# Patient Record
Sex: Female | Born: 1960 | Race: White | Hispanic: No | Marital: Married | Smoking: Never smoker
Health system: Southern US, Community
[De-identification: ages and names within clinical notes are randomized; demographics above are authoritative.]

## PROBLEM LIST (undated history)

## (undated) DIAGNOSIS — E785 Hyperlipidemia, unspecified: Secondary | ICD-10-CM

## (undated) HISTORY — PX: COSMETIC SURGERY: SHX468

## (undated) HISTORY — DX: Hyperlipidemia, unspecified: E78.5

---

## 2002-03-05 ENCOUNTER — Other Ambulatory Visit: Admission: RE | Admit: 2002-03-05 | Discharge: 2002-03-05 | Payer: Self-pay | Admitting: Obstetrics & Gynecology

## 2002-05-16 ENCOUNTER — Encounter: Payer: Self-pay | Admitting: Obstetrics & Gynecology

## 2002-05-16 ENCOUNTER — Encounter: Admission: RE | Admit: 2002-05-16 | Discharge: 2002-05-16 | Payer: Self-pay | Admitting: Obstetrics & Gynecology

## 2003-05-18 ENCOUNTER — Other Ambulatory Visit: Admission: RE | Admit: 2003-05-18 | Discharge: 2003-05-18 | Payer: Self-pay | Admitting: Obstetrics & Gynecology

## 2004-09-25 HISTORY — PX: ENDOMETRIAL ABLATION: SHX621

## 2004-11-01 ENCOUNTER — Other Ambulatory Visit: Admission: RE | Admit: 2004-11-01 | Discharge: 2004-11-01 | Payer: Self-pay | Admitting: Obstetrics & Gynecology

## 2006-12-25 ENCOUNTER — Emergency Department (HOSPITAL_COMMUNITY): Admission: EM | Admit: 2006-12-25 | Discharge: 2006-12-25 | Payer: Self-pay | Admitting: Family Medicine

## 2007-04-12 ENCOUNTER — Ambulatory Visit: Payer: Self-pay | Admitting: Internal Medicine

## 2007-04-12 LAB — CONVERTED CEMR LAB
ALT: 28 units/L (ref 0–35)
AST: 26 units/L (ref 0–37)
Albumin: 4.1 g/dL (ref 3.5–5.2)
Alkaline Phosphatase: 48 units/L (ref 39–117)
BUN: 7 mg/dL (ref 6–23)
Basophils Absolute: 0.1 10*3/uL (ref 0.0–0.1)
Basophils Relative: 1.5 % — ABNORMAL HIGH (ref 0.0–1.0)
Bilirubin, Direct: 0.1 mg/dL (ref 0.0–0.3)
CO2: 28 meq/L (ref 19–32)
Calcium: 9.2 mg/dL (ref 8.4–10.5)
Chloride: 107 meq/L (ref 96–112)
Cholesterol: 251 mg/dL (ref 0–200)
Creatinine, Ser: 0.8 mg/dL (ref 0.4–1.2)
Direct LDL: 142.8 mg/dL
Eosinophils Absolute: 0.1 10*3/uL (ref 0.0–0.6)
Eosinophils Relative: 3 % (ref 0.0–5.0)
GFR calc Af Amer: 99 mL/min
GFR calc non Af Amer: 82 mL/min
Glucose, Bld: 93 mg/dL (ref 70–99)
HCT: 39 % (ref 36.0–46.0)
HDL: 41.6 mg/dL (ref >39.0)
Hemoglobin: 13.8 g/dL (ref 12.0–15.0)
Lymphocytes Relative: 41.2 % (ref 12.0–46.0)
MCHC: 35.5 g/dL (ref 30.0–36.0)
MCV: 93.6 fL (ref 78.0–100.0)
Monocytes Absolute: 0.4 10*3/uL (ref 0.2–0.7)
Monocytes Relative: 10.5 % (ref 3.0–11.0)
Neutro Abs: 1.7 10*3/uL (ref 1.4–7.7)
Neutrophils Relative %: 43.8 % (ref 43.0–77.0)
Platelets: 221 10*3/uL (ref 150–400)
Potassium: 4.5 meq/L (ref 3.5–5.1)
RBC: 4.17 M/uL (ref 3.87–5.11)
RDW: 11.6 % (ref 11.5–14.6)
Sodium: 142 meq/L (ref 135–145)
TSH: 2.19 microintl units/mL (ref 0.35–5.50)
Total Bilirubin: 0.8 mg/dL (ref 0.3–1.2)
Total CHOL/HDL Ratio: 6
Total Protein: 6.9 g/dL (ref 6.0–8.3)
Triglycerides: 537 mg/dL (ref 0–149)
VLDL: 107 mg/dL — ABNORMAL HIGH (ref 0–40)
WBC: 3.9 10*3/uL — ABNORMAL LOW (ref 4.5–10.5)

## 2007-04-30 ENCOUNTER — Encounter: Admission: RE | Admit: 2007-04-30 | Discharge: 2007-04-30 | Payer: Self-pay | Admitting: Obstetrics & Gynecology

## 2007-05-14 ENCOUNTER — Ambulatory Visit: Payer: Self-pay | Admitting: Internal Medicine

## 2008-07-04 IMAGING — CR DG CERVICAL SPINE COMPLETE 4+V
5 series · 5 of 5 positions shown · non-contrast
Comparison: none

CLINICAL DATA: Neck pain and stiffness. No known injury.
 CERVICAL SPINE ? 5 VIEW:

[view not recorded (1 of 5)]
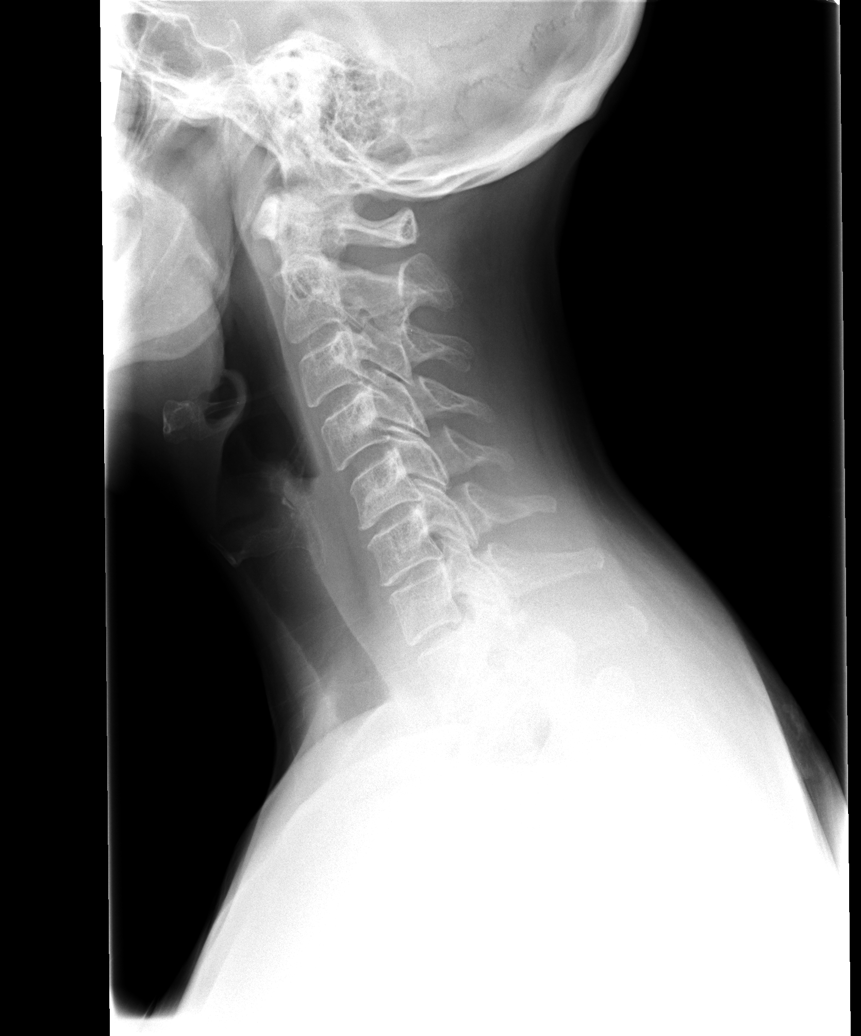

[view not recorded (2 of 5)]
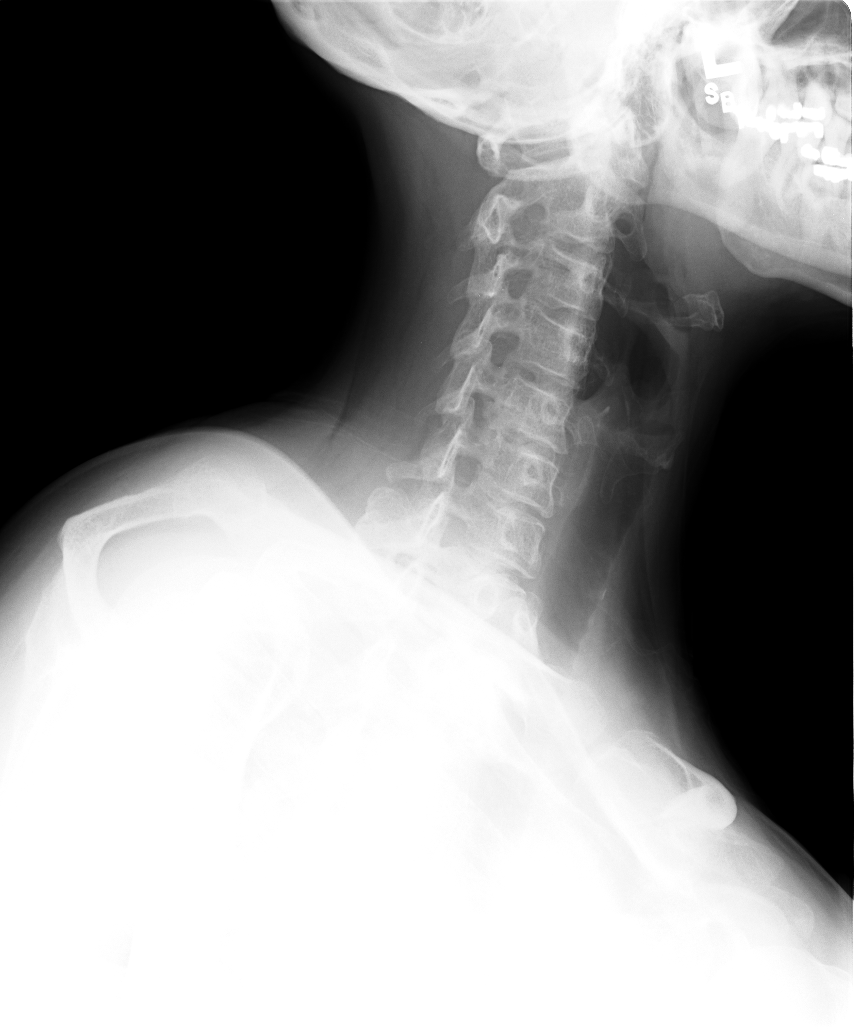

[view not recorded (3 of 5)]
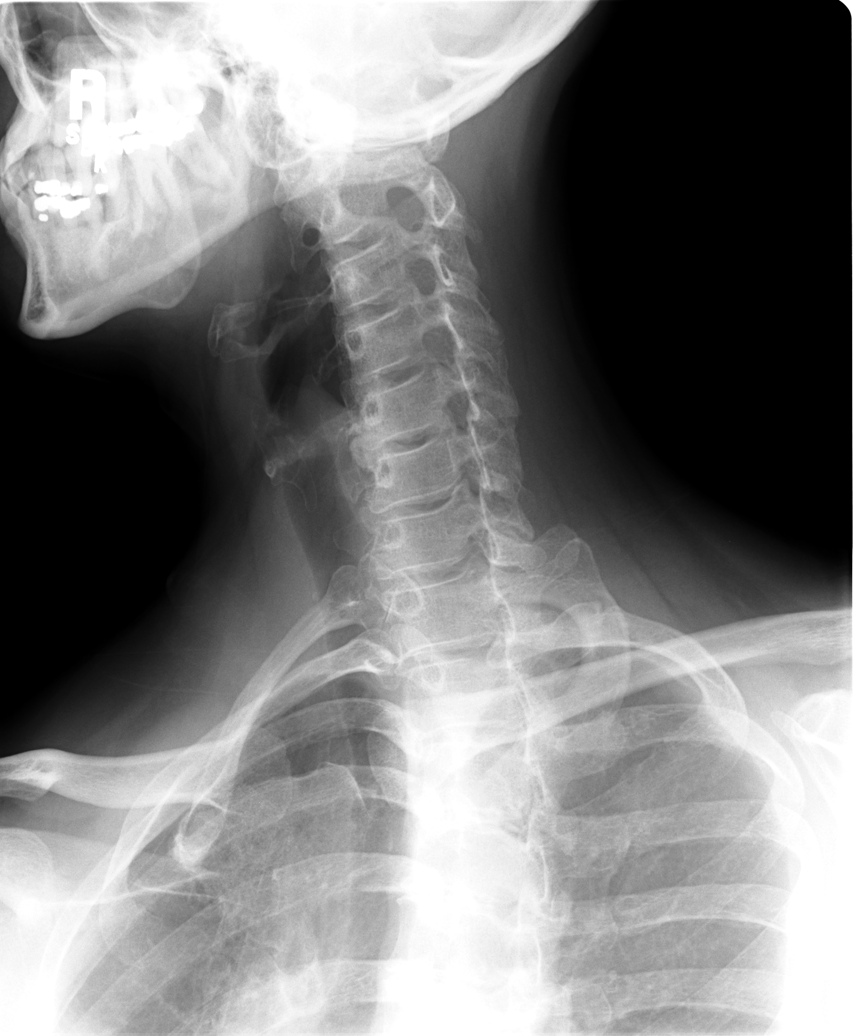

[view not recorded (4 of 5)]
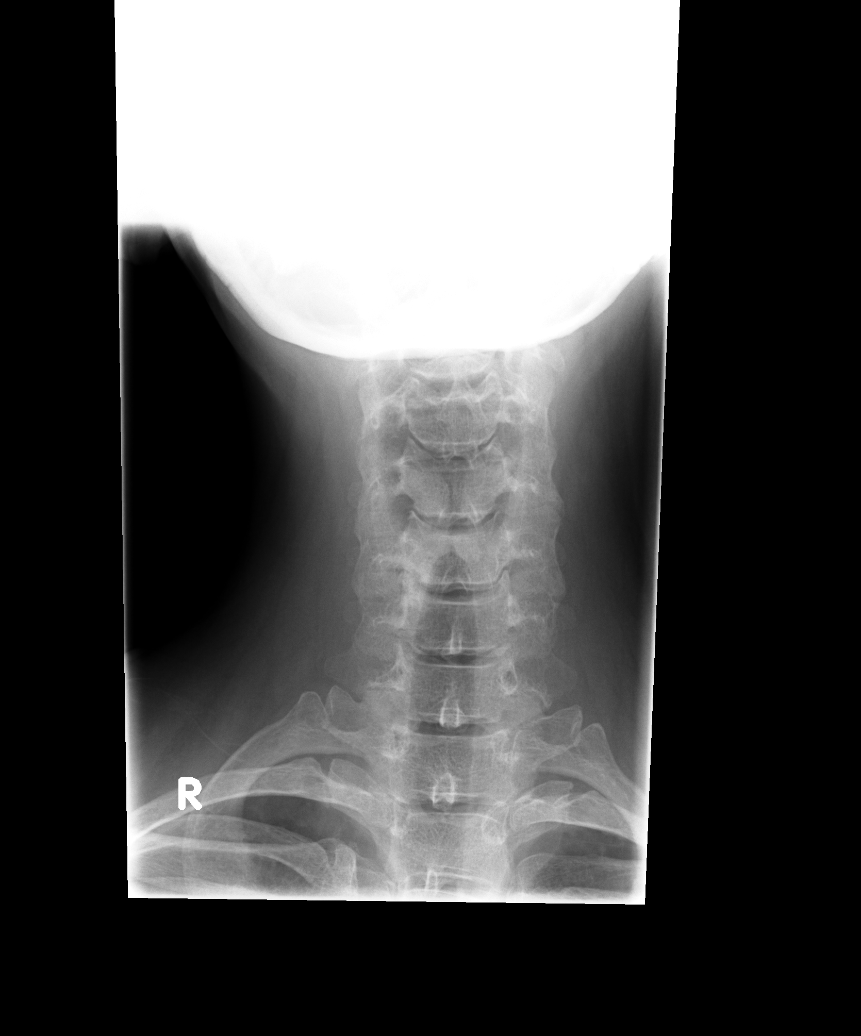

[view not recorded (5 of 5)]
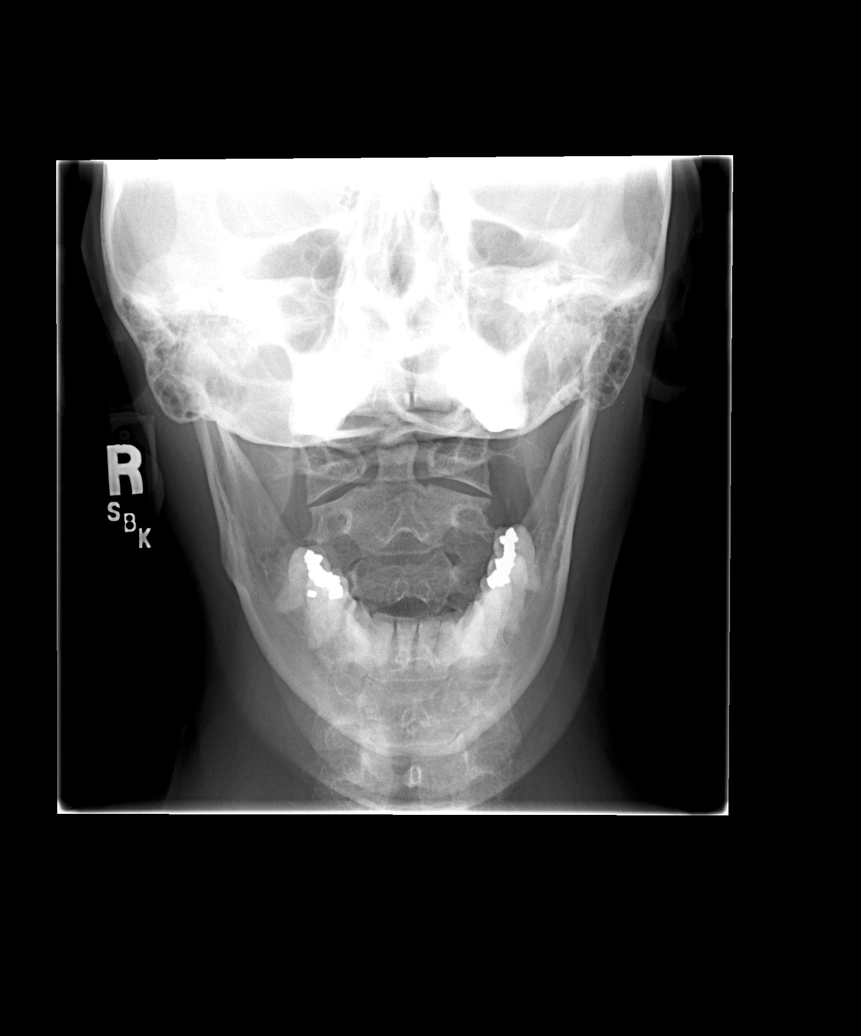

[5 of 5 positions shown; findings below may reference images not displayed]

FINDINGS: Normal alignment. No fracture. Disk degeneration and spurring at C5-6 with biforaminal narrowing, right greater than left due to spurring. The remaining disk spaces are normal.
IMPRESSION: Disk degeneration with spondylosis at C5-6.  No acute bony abnormality.

## 2008-10-30 ENCOUNTER — Encounter: Payer: Self-pay | Admitting: Internal Medicine

## 2011-02-07 NOTE — Assessment & Plan Note (Signed)
[facility]                           PRIMARY CARE OFFICE NOTE   NAME:Tiffany Tiffany [name], Tiffany [name]                      MRN:          [id number]  DATE:04/12/2007                            DOB:          02/23/1961    HISTORY AND PHYSICAL:   CHIEF COMPLAINT:  New patient to practice.   HISTORY OF PRESENT ILLNESS:  The patient is a 49 year old white female  here to establish primary care.  Her husband, Tiffany Tiffany [name], is a  patient in our practice.  She suffers from chronic back pain and was  evaluated by Dr. Ophelia Charter, found to have bone spurs in her neck.  He  recommended possible surgery; however, she has been relatively  asymptomatic and wishes to defer surgery at this time.   Besides occasional heartburn symptoms, she has no chronic illnesses.  She denies any history of hypertension or type 2 diabetes.   PAST MEDICAL HISTORY SUMMARY:  1. Gastroesophageal reflux disease.  2. Status post fall with right wrist fracture in 1996 requiring      external fixator.  3. Eye lift and tummy tuck in 2006.  4. D&C and endometrial ablation secondary to fibroids in 2007.  5. Herpes labialis.   CURRENT MEDICATIONS:  1. Pepcid once a day.  2. Multivitamin once a day.  3. Calcium with vitamin D twice daily.   ALLERGIES TO MEDICATIONS:  None known.   SOCIAL HISTORY:  The patient is married, two children ages 14 and 93.  She currently works as a Lawyer.   FAMILY HISTORY:  Mother deceased at age 62 secondary to complications of  esophageal cancer.  She was hypertensive.  Father was killed in [place].  The patient has one sister, who is in good health.   HABITS:  She averages 8-10 beers per week.  She does not smoke, has  never used tobacco products.  She denies recreational drug use.   PREVENTIVE CARE HISTORY:  Last Pap was in 2007.  Last mammogram was in  2007.  Tetanus shot in 2007.  Pneumovax in 2006.   REVIEW OF SYSTEMS:  No HEENT symptoms.  Denies any chest pain,  shortness  of breath, dyspnea on exertion.  No dysphagia.  No weight loss.  All  other systems negative.   PHYSICAL EXAMINATION:  VITAL SIGNS:  Height is 5 feet 4 inches, weight  is 147.5 pounds.  Temperature is 97.8, pulse is 91, BP is 132/81.  GENERAL:  The patient is a pleasant, well-developed, well-nourished 78-  year-old white female in no apparent distress.  HEENT:  Normocephalic, atraumatic.  Pupils were equal and reactive to  light bilaterally.  Extraocular motility was intact.  The patient was  were anicteric.  Conjunctivae within normal limits.  External auditory  canals and tympanic membranes were unremarkable.  Oropharyngeal exam  revealed normal dentition.  NECK:  Supple.  No adenopathy, carotid bruit or thyromegaly.  CHEST:  Normal respiratory effort.  Chest is clear to auscultation  bilaterally.  No rhonchi, rales or wheezing.  CARDIOVASCULAR:  Regular rate and rhythm.  No significant murmurs, rubs  or gallops appreciated.  ABDOMEN:  Soft, nontender, positive bowel sounds, no organomegaly.  MUSCULOSKELETAL:  No clubbing, cyanosis, or edema.  SKIN:  Warm and dry.  NEUROLOGIC:  Cranial nerves II-XII were grossly intact.  She was  nonfocal.   IMPRESSION:  1. 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  2. Gastroesophageal reflux disease, controlled with an H2 blocker.  3. History of fibroids, status post endometrial ablation.  4. Health maintenance.   RECOMMENDATIONS:  1. I recommended p.r.n. NSAIDs over-the-counter as needed for neck      pain.  I agree with the patient in deferring surgery until symptoms      worsen.  2. We will obtain screening labs, including cholesterol, fasting blood      sugar, and thyroid studies, arrange follow-up yearly.     Barbette Hair. Artist Pais, DO  Electronically Signed    RDY/MedQ  DD: 04/12/2007  DT: 04/13/2007  Job #: [id number]

## 2011-11-20 ENCOUNTER — Ambulatory Visit: Payer: Self-pay | Admitting: Internal Medicine

## 2011-12-11 ENCOUNTER — Ambulatory Visit: Payer: Self-pay | Admitting: Internal Medicine

## 2011-12-11 DIAGNOSIS — Z0289 Encounter for other administrative examinations: Secondary | ICD-10-CM

## 2012-11-04 ENCOUNTER — Other Ambulatory Visit (INDEPENDENT_AMBULATORY_CARE_PROVIDER_SITE_OTHER): Payer: 59

## 2012-11-04 ENCOUNTER — Ambulatory Visit (INDEPENDENT_AMBULATORY_CARE_PROVIDER_SITE_OTHER): Payer: 59 | Admitting: Internal Medicine

## 2012-11-04 ENCOUNTER — Encounter: Payer: Self-pay | Admitting: Internal Medicine

## 2012-11-04 VITALS — BP 132/82 | HR 102 | Temp 100.1°F | Ht 64.0 in | Wt 157.0 lb

## 2012-11-04 DIAGNOSIS — E785 Hyperlipidemia, unspecified: Secondary | ICD-10-CM | POA: Insufficient documentation

## 2012-11-04 DIAGNOSIS — R52 Pain, unspecified: Secondary | ICD-10-CM

## 2012-11-04 DIAGNOSIS — J111 Influenza due to unidentified influenza virus with other respiratory manifestations: Secondary | ICD-10-CM

## 2012-11-04 LAB — POCT INFLUENZA A/B
Influenza A, POC: POSITIVE
Influenza B, POC: POSITIVE

## 2012-11-04 MED ORDER — ONDANSETRON HCL 4 MG PO TABS: 4.0000 mg | ORAL_TABLET | Freq: Three times a day (TID) | ORAL | Status: AC | PRN

## 2012-11-04 MED ORDER — OSELTAMIVIR PHOSPHATE 75 MG PO CAPS: 75.0000 mg | ORAL_CAPSULE | Freq: Two times a day (BID) | ORAL | Status: DC

## 2012-11-04 NOTE — Assessment & Plan Note (Signed)
52 year old white female with signs and symptoms of acute influenza. POC influenza test is positive.  She complains of cough but lung exam is clear. Treat with Tamiflu 75 mg twice daily. Use acetaminophen 650 mg every 8 hours as needed. Patient has sinus tachycardia on exam. She was encouraged to push fluids.  Patient advised to call office if symptoms persist or worsen.

## 2012-11-04 NOTE — Patient Instructions (Addendum)
Push fluids as directed.  Contact our office if you experience persistent tachycardia (fast heart rate) Take acetaminophen 650 mg every 8 hrs as needed Hold Lovastatin until you are feeling better

## 2012-11-04 NOTE — Progress Notes (Signed)
  Subjective:    Patient ID: Tiffany [name], female    DOB: Jan 23, 1961, 52 y.o.   MRN: [id number]  HPI  52 year old white female with history of hyperlipidemia to establish. Patient complains of severe muscle aches and fever over last 24 hours. She did not get a flu vaccine this year. She complains of cough that is nonproductive. She denies shortness of breath. No known sick contacts.  Patient was started on lovastatin one month ago for hyperlipidemia. She has been tolerating Mevacor without difficulty.   Review of Systems  Constitutional: Positive for fever, chills and fatigue.  HENT: Positive for congestion. Negative for neck pain.   Eyes: Negative for visual disturbance.  Respiratory: Positive for cough. Negative for shortness of breath.   Cardiovascular: Negative for chest pain.  Genitourinary: Negative for difficulty urinating.   One episode of nausea and vomiting yesterday     Past Medical History  Diagnosis Date  . Hyperlipidemia     History   Social History  . Marital Status: Married    Spouse Name: N/A    Number of Children: N/A  . Years of Education: N/A   Occupational History  . Not on file.   Social History Main Topics  . Smoking status: Never Smoker   . Smokeless tobacco: Not on file  . Alcohol Use: 5.0 oz/week    10 drink(s) per week  . Drug Use: No  . Sexually Active: Not on file   Other Topics Concern  . Not on file   Social History Narrative   Husband is Magdalene Molly    Past Surgical History  Procedure Laterality Date  . Endometrial ablation  2006  . Cosmetic surgery      "Tummy Tuck", eye surgery    Family History  Problem Relation Age of Onset  . Esophageal cancer Mother   . Coronary artery disease Paternal Grandfather   . Stroke Paternal Grandfather   . Breast cancer Maternal Grandmother     No Known Allergies  No current outpatient prescriptions on file prior to visit.   No current facility-administered medications on file  prior to visit.    BP 132/82  Pulse 102  Temp(Src) 100.1 F (37.8 C) (Oral)  Ht 5\' 4"  (1.626 m)  Wt 157 lb (71.215 kg)  BMI 26.94 kg/m2    Objective:   Physical Exam  Constitutional: She is oriented to person, place, and time. She appears well-developed and well-nourished.  HENT:  Head: Normocephalic and atraumatic.  Right Ear: External ear normal.  Left Ear: External ear normal.  Mouth/Throat: No oropharyngeal exudate.  Oropharyngeal erythema  Eyes: Conjunctivae and EOM are normal. Pupils are equal, round, and reactive to light.  Neck: Neck supple.  Cardiovascular: Normal rate.   No murmur heard. Sinus tachycardia  Pulmonary/Chest: Effort normal and breath sounds normal. She has no wheezes.  Abdominal: Soft. Bowel sounds are normal. She exhibits no mass.  Musculoskeletal: She exhibits no edema.  Lymphadenopathy:    She has no cervical adenopathy.  Neurological: She is alert and oriented to person, place, and time. No cranial nerve deficit.  Skin: Skin is warm and dry.  Psychiatric: She has a normal mood and affect. Her behavior is normal.          Assessment & Plan:

## 2012-11-04 NOTE — Assessment & Plan Note (Signed)
Patient started on lovastatin one month ago at urgent care clinic. She does not recall her cholesterol readings. Patient advised to hold cholesterol medications until symptoms of influenza resolved.

## 2014-03-12 ENCOUNTER — Telehealth: Payer: Self-pay | Admitting: Internal Medicine

## 2014-03-12 NOTE — Telephone Encounter (Signed)
Noted  

## 2014-03-12 NOTE — Telephone Encounter (Signed)
Patient Information:  Caller Name: Tiffany LimHolly  Phone: [phone]  Patient: Tiffany [name], Tiffany [name]  Gender: Female  DOB: [birth year]  Age: 53 Years  PCP: Tiffany PaisYoo, Doe-Hyun Molly Maduro(Robert) (Adults only)  Pregnant: No  Office Follow Up:  Does the office need to follow up with this patient?: No  Instructions For The Office: N/A  RN Note:  Home care advice and call back parmaeters reviewed. No appt available with provider. Office booked. Scheduled with Dr. Kriste BasqueHannah [name] tomorrow 03/13/14 at 09:15 for evaluation.  Symptoms  Reason For Call & Symptoms: Patient would like appt to see Dr. Artist PaisYoo . [facility] . Given an injection , hydrocortisone cream- 1%.  She reports areas are not getting better. +itching and now oozing from scratching. Locations- both calfs, top left thigh, unde right  breast, bilateral forearms.  Afebrile.  Reviewed Health History In EMR: Yes  Reviewed Medications In EMR: Yes  Reviewed Allergies In EMR: Yes  Reviewed Surgeries / Procedures: Yes  Date of Onset of Symptoms: 03/05/2014  Treatments Tried: steroid shot, steroid cream-   Ivyrest, calamine cream, alcohol.  Treatments Tried Worked: No OB / GYN:  LMP: Unknown  Guideline(s) Used:  Poison Ivy - Oak or [facility]  Disposition Per Guideline:   See Today in Office  Reason For Disposition Reached:   Patient wants to be seen  Advice Given:  Hydrocortisone Cream for Itching:   Apply 1% hydrocortisone cream 4 times a day to reduce itching. Use it for 5 days.  Keep the cream in the refrigerator (Reason: it feels better if applied cold).  Apply Cold to the Area:  Soak the involved area in cool water for 20 minutes or massage it with an ice cube as often as necessary to reduce itching and oozing.  Oral Antihistamine Medication for Itching:   Do not take antihistamine medications if you have prostate problems.  Antihistamines may cause sleepiness. Do not drink, drive, or operate dangerous machinery  while taking antihistamines.  An over-the-counter antihistamine that causes less sleepiness is loratadine (e.g., Alavert or Claritin).  Read the package instructions thoroughly on all medications that you take.  Avoid Scratching:   Cut your fingernails short and try not to scratch so as to prevent a secondary infection from bacteria.  Call Back If:  It looks infected  You become worse.  Patient Will Follow Care Advice:  YES  Appointment Scheduled:  03/13/2014 09:15:00 Appointment Scheduled Provider:  Selena BattenKim (TEXT 1st, after 20 mins can call), Tiffany ClientHannah [facility](Family Practice)

## 2014-03-13 ENCOUNTER — Ambulatory Visit (INDEPENDENT_AMBULATORY_CARE_PROVIDER_SITE_OTHER): Payer: 59 | Admitting: Family Medicine

## 2014-03-13 ENCOUNTER — Encounter: Payer: Self-pay | Admitting: Family Medicine

## 2014-03-13 VITALS — BP 124/80 | HR 79 | Temp 98.8°F | Ht 64.0 in | Wt 150.5 lb

## 2014-03-13 DIAGNOSIS — L259 Unspecified contact dermatitis, unspecified cause: Secondary | ICD-10-CM

## 2014-03-13 MED ORDER — PREDNISONE 10 MG PO TABS: ORAL_TABLET | ORAL | Status: AC

## 2014-03-13 NOTE — Progress Notes (Signed)
No chief complaint on file.   HPI:  Acute visit for:  1) ? Poison Oak: -got into poison oak last week -rash is on legs and arms extensively - itchy and vesicular -denies: SOB, facial or mucus membrane lesions, nausea, vomiting, fever -went to urgent care 1 week ago and given shot and pramsone  ROS: See pertinent positives and negatives per HPI.  Past Medical History  Diagnosis Date  . Hyperlipidemia     Past Surgical History  Procedure Laterality Date  . Endometrial ablation  2006  . Cosmetic surgery      "Tummy Tuck", eye surgery    Family History  Problem Relation Age of Onset  . Esophageal cancer Mother   . Coronary artery disease Paternal Grandfather   . Stroke Paternal Grandfather   . Breast cancer Maternal Grandmother     History   Social History  . Marital Status: Married    Spouse Name: N/A    Number of Children: N/A  . Years of Education: N/A   Social History Main Topics  . Smoking status: Never Smoker   . Smokeless tobacco: None  . Alcohol Use: 5.0 oz/week    10 drink(s) per week  . Drug Use: No  . Sexual Activity: None   Other Topics Concern  . None   Social History Narrative   Husband is Magdalene Mollyobert Proctor    Current outpatient prescriptions:estradiol-norethindrone (MIMVEY) 1-0.5 MG per tablet, Take 1 tablet by mouth daily., Disp: , Rfl: ;  ondansetron (ZOFRAN) 4 MG tablet, Take 1 tablet (4 mg total) by mouth every 8 (eight) hours as needed for nausea., Disp: 30 tablet, Rfl: 0 predniSONE (DELTASONE) 10 MG tablet, 5 tablets (50mg ) daily for 3 days, then 4 tablets (40 mg) daily for 3 days, then 3 tablets (30mg ) daily for 3 days, then 2 tablets (20 mg) daily for 3 days, then 1 tablet (10 mg) daily for 3 days., Disp: 45 tablet, Rfl: 0  EXAM:  Filed Vitals:   03/13/14 0918  BP: 124/80  Pulse: 79  Temp: 98.8 F (37.1 C)    Body mass index is 25.82 kg/(m^2).  GENERAL: vitals reviewed and listed above, alert, oriented, appears well hydrated and  in no acute distress  HEENT: atraumatic, conjunttiva clear, no obvious abnormalities on inspection of external nose and ears  NECK: no obvious masses on inspection  LUNGS: clear to auscultation bilaterally, no wheezes, rales or rhonchi, good air movement  CV: HRRR, no peripheral edema  SKIN: papulovesicular rash on arms and legs extensively, no facial lesions, no signs of infection  MS: moves all extremities without noticeable abnormality  PSYCH: pleasant and cooperative, no obvious depression or anxiety  ASSESSMENT AND PLAN:  Discussed the following assessment and plan:  Contact dermatitis - Plan: predniSONE (DELTASONE) 10 MG tablet  -classic signs and symptoms of poison oak dermatitis -treatment options and risks discussed - she opted for steroid taper, rebound discussed -Patient advised to return or notify a doctor immediately if symptoms worsen or persist or new concerns arise.  Patient Instructions  -As we discussed, we have prescribed a new medication for you at this appointment. We discussed the common and serious potential adverse effects of this medication and you can review these and more with the pharmacist when you pick up your medication.  Please follow the instructions for use carefully and notify us immediately if you have any problems taking this medication.  Poison [facility] Poison oak is an inflammation of the skin (contact dermatitis). It  is caused by contact with the allergens on the leaves of the oak (toxicodendron) plants. Depending on your sensitivity, the rash may consist simply of redness and itching, or it may also progress to blisters which may break open (rupture). These must be well cared for to prevent secondary germ (bacterial) infection as these infections can lead to scarring. The eyes may also get puffy. The puffiness is worst in the morning and gets better as the day progresses. Healing is best accomplished by keeping any open areas dry, clean, covered with  a bandage, and covered with an antibacterial ointment if needed. Without secondary infection, this dermatitis usually heals without scarring within 2 to 3 weeks without treatment. HOME CARE INSTRUCTIONS When you have been exposed to poison oak, it is very important to thoroughly wash with soap and water as soon as the exposure has been discovered. You have about one half hour to remove the plant resin before it will cause the rash. This cleaning will quickly destroy the oil or antigen on the skin (the antigen is what causes the rash). Wash aggressively under the fingernails as any plant resin still there will continue to spread the rash. Do not rub skin vigorously when washing affected area. Poison oak cannot spread if no oil from the plant remains on your body. Rash that has progressed to weeping sores (lesions) will not spread the rash unless you have not washed thoroughly. It is also important to clean any clothes you have been wearing as they may carry active allergens which will spread the rash, even several days later. Avoidance of the plant in the future is the best measure. Poison oak plants can be recognized by the number of leaves. Generally, poison oak has three leaves with flowering branches on a single stem. Diphenhydramine may be purchased over the counter and used as needed for itching. Do not drive with this medication if it makes you drowsy. Ask your caregiver about medication for children. SEEK IMMEDIATE MEDICAL CARE IF:   Open areas of the rash develop.  You notice redness extending beyond the area of the rash.  There is a pus like discharge.  There is increased pain.  Other signs of infection develop (such as fever). Document Released: 03/18/2003 Document Revised: 12/04/2011 Document Reviewed: 07/28/2009 [facility] Patient Information 2015 [place], [place]. This information is not intended to replace advice given to you by your health care provider. Make sure you discuss any  questions you have with your health care provider.      Kriste BasqueKIM, HANNAH R.

## 2014-03-13 NOTE — Progress Notes (Signed)
Pre visit review using our clinic review tool, if applicable. No additional management support is needed unless otherwise documented below in the visit note. 

## 2014-03-13 NOTE — Patient Instructions (Signed)
-  As we discussed, we have prescribed a new medication for you at this appointment. We discussed the common and serious potential adverse effects of this medication and you can review these and more with the pharmacist when you pick up your medication.  Please follow the instructions for use carefully and notify us immediately if you have any problems taking this medication.  [facility] Poison oak is an inflammation of the skin (contact dermatitis). It is caused by contact with the allergens on the leaves of the oak (toxicodendron) plants. Depending on your sensitivity, the rash may consist simply of redness and itching, or it may also progress to blisters which may break open (rupture). These must be well cared for to prevent secondary germ (bacterial) infection as these infections can lead to scarring. The eyes may also get puffy. The puffiness is worst in the morning and gets better as the day progresses. Healing is best accomplished by keeping any open areas dry, clean, covered with a bandage, and covered with an antibacterial ointment if needed. Without secondary infection, this dermatitis usually heals without scarring within 2 to 3 weeks without treatment. HOME CARE INSTRUCTIONS When you have been exposed to poison oak, it is very important to thoroughly wash with soap and water as soon as the exposure has been discovered. You have about one half hour to remove the plant resin before it will cause the rash. This cleaning will quickly destroy the oil or antigen on the skin (the antigen is what causes the rash). Wash aggressively under the fingernails as any plant resin still there will continue to spread the rash. Do not rub skin vigorously when washing affected area. Poison oak cannot spread if no oil from the plant remains on your body. Rash that has progressed to weeping sores (lesions) will not spread the rash unless you have not washed thoroughly. It is also important to clean any clothes you have  been wearing as they may carry active allergens which will spread the rash, even several days later. Avoidance of the plant in the future is the best measure. Poison oak plants can be recognized by the number of leaves. Generally, poison oak has three leaves with flowering branches on a single stem. Diphenhydramine may be purchased over the counter and used as needed for itching. Do not drive with this medication if it makes you drowsy. Ask your caregiver about medication for children. SEEK IMMEDIATE MEDICAL CARE IF:   Open areas of the rash develop.  You notice redness extending beyond the area of the rash.  There is a pus like discharge.  There is increased pain.  Other signs of infection develop (such as fever). Document Released: 03/18/2003 Document Revised: 12/04/2011 Document Reviewed: 07/28/2009 [facility] Patient Information 2015 [place], [place]. This information is not intended to replace advice given to you by your health care provider. Make sure you discuss any questions you have with your health care provider.

## 2015-02-15 ENCOUNTER — Telehealth: Payer: Self-pay

## 2015-02-15 NOTE — Telephone Encounter (Signed)
[facility]
